# Patient Record
Sex: Male | Born: 1955 | Race: White | Hispanic: No | Marital: Married | Smoking: Never smoker
Health system: Southern US, Community
[De-identification: ages and names within clinical notes are randomized; demographics above are authoritative.]

## PROBLEM LIST (undated history)

## (undated) DIAGNOSIS — K219 Gastro-esophageal reflux disease without esophagitis: Secondary | ICD-10-CM

## (undated) DIAGNOSIS — R7301 Impaired fasting glucose: Secondary | ICD-10-CM

## (undated) DIAGNOSIS — E785 Hyperlipidemia, unspecified: Secondary | ICD-10-CM

## (undated) HISTORY — DX: Impaired fasting glucose: R73.01

## (undated) HISTORY — PX: HERNIA REPAIR: SHX51

## (undated) HISTORY — DX: Gastro-esophageal reflux disease without esophagitis: K21.9

## (undated) HISTORY — DX: Hyperlipidemia, unspecified: E78.5

---

## 2004-07-08 ENCOUNTER — Ambulatory Visit (HOSPITAL_COMMUNITY): Admission: RE | Admit: 2004-07-08 | Discharge: 2004-07-08 | Payer: Self-pay | Admitting: General Surgery

## 2018-02-12 ENCOUNTER — Encounter: Payer: Self-pay | Admitting: Interventional Cardiology

## 2018-03-10 ENCOUNTER — Encounter: Payer: Self-pay | Admitting: Interventional Cardiology

## 2018-03-10 ENCOUNTER — Ambulatory Visit (INDEPENDENT_AMBULATORY_CARE_PROVIDER_SITE_OTHER): Payer: PRIVATE HEALTH INSURANCE | Admitting: Interventional Cardiology

## 2018-03-10 ENCOUNTER — Encounter (INDEPENDENT_AMBULATORY_CARE_PROVIDER_SITE_OTHER): Payer: Self-pay

## 2018-03-10 VITALS — BP 118/78 | HR 73 | Ht 67.0 in | Wt 187.0 lb

## 2018-03-10 DIAGNOSIS — Z8249 Family history of ischemic heart disease and other diseases of the circulatory system: Secondary | ICD-10-CM

## 2018-03-10 DIAGNOSIS — E785 Hyperlipidemia, unspecified: Secondary | ICD-10-CM

## 2018-03-10 NOTE — Progress Notes (Signed)
Cardiology Office Note:    Date:  03/10/2018   ID:  Gabriel Stephens, DOB 07/03/1955, MRN [id number]  PCP:  Gabriel JoeSwayne, David, MD  Cardiologist:  No primary care provider on file.   Referring MD: No ref. provider found   Chief Complaint  Patient presents with  . Advice Only    Family history CAD  . Hyperlipidemia    History of Present Illness:    Gabriel Stephens is a 63 y.o. male with a strong family history of CAD (father), prediabetes, hyperlipidemia, who is referred by Gabriel Stephens for risk evaluation and longitudinal preventative management.  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  He has no cardiac complaints.  He exercises relatively regularly achieving between 120 and 150 minutes of moderate activity per week.  There are no limitations with reference to endurance, dyspnea, or chest pain with cardio/aerobic activity.  He has never had a cardiac event and specifically denies palpitations, orthopnea, PND, and syncope.  He wants to be proactive with reference to the potential development of vascular disease.  His risk profile includes family history of premature atherosclerosis (father who had his first myocardial infarction younger than age 61), hyperlipidemia with most recent LDL C of 142 and cholesterol total of 218.  Has never smoked.  He is not diabetic.  He sleeps well and does not have body type features of one with sleep apnea.  Past Medical History:  Diagnosis Date  . Elevated fasting glucose    history of mildly  . GERD (gastroesophageal reflux disease)   . Hyperlipidemia     Past Surgical History:  Procedure Laterality Date  . HERNIA REPAIR      Current Medications: No outpatient medications have been marked as taking for the 03/10/18 encounter (Office Visit) with Lyn RecordsSmith, Gabriel Bertucci W, MD.     Allergies:   Patient has no known allergies.   Social History   Socioeconomic History  . Marital status: Married   Spouse name: LORI  . Number of children: 3  . Years of education: COLLEGE  . Highest education level: Not on file  Occupational History  . Occupation: Airline pilotALES  Social Needs  . Financial resource strain: Not on file  . Food insecurity:    Worry: Not on file    Inability: Not on file  . Transportation needs:    Medical: Not on file    Non-medical: Not on file  Tobacco Use  . Smoking status: Never Smoker  . Smokeless tobacco: Never Used  Substance and Sexual Activity  . Alcohol use: Yes  . Drug use: Never  . Sexual activity: Not on file  Lifestyle  . Physical activity:    Days per week: Not on file    Minutes per session: Not on file  . Stress: Not on file  Relationships  . Social connections:    Talks on phone: Not on file    Gets together: Not on file    Attends religious service: Not on file    Active member of club or organization: Not on file    Attends meetings of clubs or organizations: Not on file    Relationship status: Not on file  Other Topics Concern  . Not on file  Social History Narrative  . Not on file     Family History: The patient's family history includes Alcoholism in his father; Congestive Heart Failure in his father; Diabetes in his father, mother,  sister, and sister; Heart attack (age of onset: 38) in his father; Heart disease in his father; Hyperlipidemia in his mother; Hypertension in his mother; Kidney disease in his father; Lung cancer in his father. There is no history of Colon cancer, Colon polyps, or Liver disease.  ROS:   Please see the history of present illness.    There is no real complaints.  Occasional joint discomfort related to activity.  All other systems reviewed and are negative.  EKGs/Labs/Other Studies Reviewed:    The following studies were reviewed today:  LDL cholesterol 142, triglyceride 162, total cholesterol 218, HDL 44, on blood work from December 18, 2017.  BUN and creatinine and blood sugar were normal.  EKG:  EKG is  normal sinus rhythm with interventricular conduction delay of nonspecific variety measuring 114 ms..  The tracing is otherwise unremarkable.  Recent Labs: No results found for requested labs within last 8760 hours.  Recent Lipid Panel No results found for: CHOL, TRIG, HDL, CHOLHDL, VLDL, LDLCALC, LDLDIRECT  Physical Exam:    VS:  BP 118/78   Pulse 73   Ht 5\' 7"  (1.702 m)   Wt 187 lb (84.8 kg)   BMI 29.29 kg/m     Wt Readings from Last 3 Encounters:  03/10/18 187 lb (84.8 kg)     GEN: Healthy-appearing.  Mildly overweight. No acute distress HEENT: Normal NECK: No JVD. LYMPHATICS: No lymphadenopathy CARDIAC: RRR.  No murmur, gallop, edema VASCULAR: Pulses are present in the radial and carotids bilaterally and 2+, Bruits are absent in the carotid region RESPIRATORY:  Clear to auscultation without rales, wheezing or rhonchi  ABDOMEN: Soft, non-tender, non-distended, No pulsatile mass, MUSCULOSKELETAL: No deformity  SKIN: Warm and dry NEUROLOGIC:  Alert and oriented x 3 PSYCHIATRIC:  Normal affect   ASSESSMENT:    1. Family history of heart disease   2. Hyperlipidemia LDL goal <70    PLAN:    In order of problems listed above:  1. Gabriel Stephens has no symptoms or clinical evidence of obstructive coronary or other vascular disease.  He is at increased risk due to family history, untreated significant hyperlipidemia, age, and sex.  I have recommended a CT coronary calcium score.  Calcium deposition he should definitely be on lipid therapy.  I encouraged continued aerobic activity and to increase duration to 150 minutes/week of moderate activity.  He currently is not inclined to use statin therapy but findings on coronary CT may influence future decision.  I do not believe functional testing will be useful in absence of symptoms in a person who is vigorously active in his typical lifestyle.   Medication Adjustments/Labs and Tests Ordered: Current medicines are reviewed at length with  the patient today.  Concerns regarding medicines are outlined above.  Orders Placed This Encounter  Procedures  . CT CARDIAC SCORING  . EKG 12-Lead   No orders of the defined types were placed in this encounter.   Patient Instructions  Medication Instructions:  No change If you need a refill on your cardiac medications before your next appointment, please call your pharmacy.   Lab work: none If you have labs (blood work) drawn today and your tests are completely normal, you will receive your results only by: Marland Kitchen MyChart Message (if you have MyChart) OR . A paper copy in the mail If you have any lab test that is abnormal or we need to change your treatment, we will call you to review the results.  Testing/Procedures: Please schedule calcium  score ct scan.   There is $150 cost.  This is not covered by insurance.  Follow-Up: Your physician recommends that you schedule a follow-up appointment as needed with Dr. Katrinka Blazing.  Any Other Special Instructions Will Be Listed Below (If Applicable).       Signed, Lesleigh Noe, MD  03/10/2018 5:23 PM    [facility]

## 2018-03-10 NOTE — Patient Instructions (Signed)
Medication Instructions:  No change If you need a refill on your cardiac medications before your next appointment, please call your pharmacy.   Lab work: none If you have labs (blood work) drawn today and your tests are completely normal, you will receive your results only by: Marland Kitchen MyChart Message (if you have MyChart) OR . A paper copy in the mail If you have any lab test that is abnormal or we need to change your treatment, we will call you to review the results.  Testing/Procedures: Please schedule calcium score ct scan.   There is $150 cost.  This is not covered by insurance.  Follow-Up: Your physician recommends that you schedule a follow-up appointment as needed with Dr. Katrinka Blazing.  Any Other Special Instructions Will Be Listed Below (If Applicable).

## 2018-03-24 ENCOUNTER — Ambulatory Visit (INDEPENDENT_AMBULATORY_CARE_PROVIDER_SITE_OTHER)
Admission: RE | Admit: 2018-03-24 | Discharge: 2018-03-24 | Disposition: A | Discharge status: Home / Self Care | Payer: Self-pay | Source: Ambulatory Visit | Attending: Interventional Cardiology | Admitting: Interventional Cardiology

## 2018-03-24 DIAGNOSIS — Z8249 Family history of ischemic heart disease and other diseases of the circulatory system: Secondary | ICD-10-CM

## 2019-11-09 ENCOUNTER — Other Ambulatory Visit: Payer: Self-pay | Admitting: General Surgery

## 2019-11-09 DIAGNOSIS — K439 Ventral hernia without obstruction or gangrene: Secondary | ICD-10-CM

## 2019-11-23 ENCOUNTER — Ambulatory Visit
Admission: RE | Admit: 2019-11-23 | Discharge: 2019-11-23 | Disposition: A | Discharge status: Home / Self Care | Payer: 59 | Source: Ambulatory Visit | Attending: General Surgery | Admitting: General Surgery

## 2019-11-23 ENCOUNTER — Other Ambulatory Visit: Payer: Self-pay

## 2019-11-23 DIAGNOSIS — K439 Ventral hernia without obstruction or gangrene: Secondary | ICD-10-CM

## 2020-12-02 IMAGING — CT CT ABDOMEN W/O CM
1 of 2 series · 13 of 32 positions shown, 19 images · non-contrast
Comparison: None

CLINICAL DATA: Abdominal pain.  History of ventral hernia

EXAM:
CT ABDOMEN WITHOUT CONTRAST
TECHNIQUE: Multidetector CT imaging of the abdomen was performed following the
standard protocol without IV contrast.

[Series 2: abd w/(date) · axial · 0.72mm/px · z∈[-337,-87]mm · 13 of 58 slices shown, 19 images]
[im 4/58  soft-tissue]
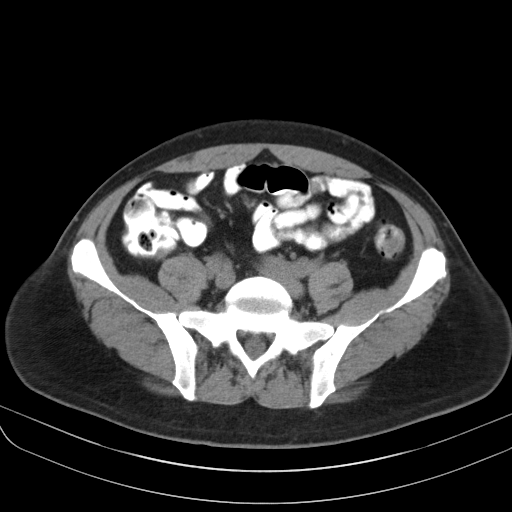
[im 4/58  bone]
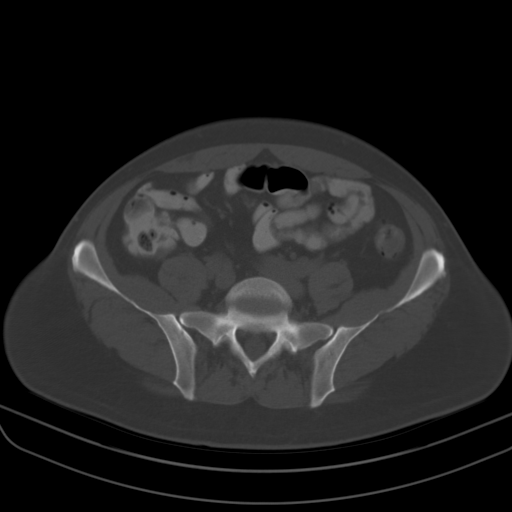
[im 8/58  soft-tissue]
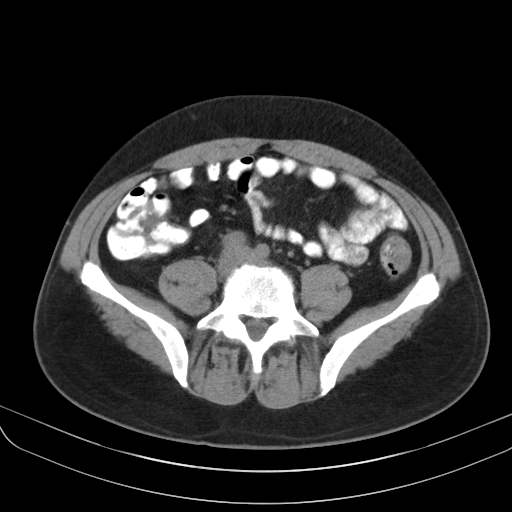
[im 12/58  soft-tissue]
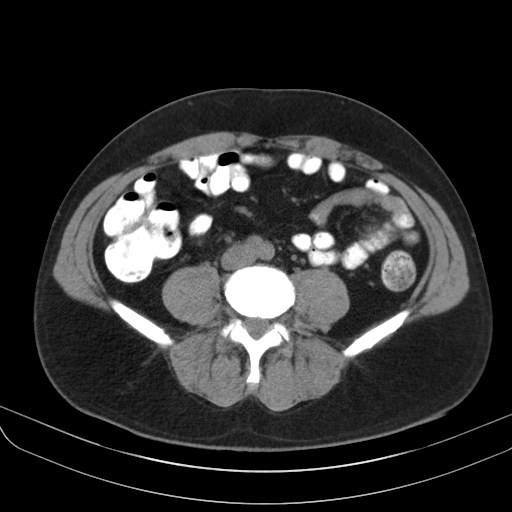
[im 16/58  soft-tissue]
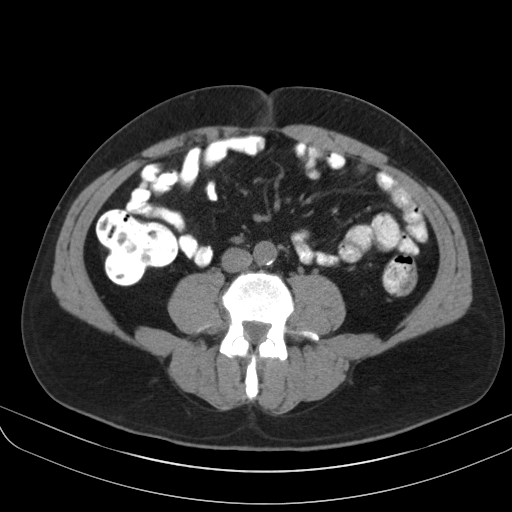
[im 20/58  soft-tissue]
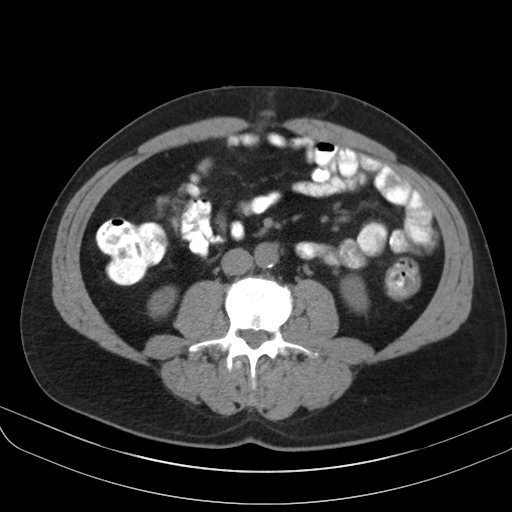
[im 23/58  soft-tissue]
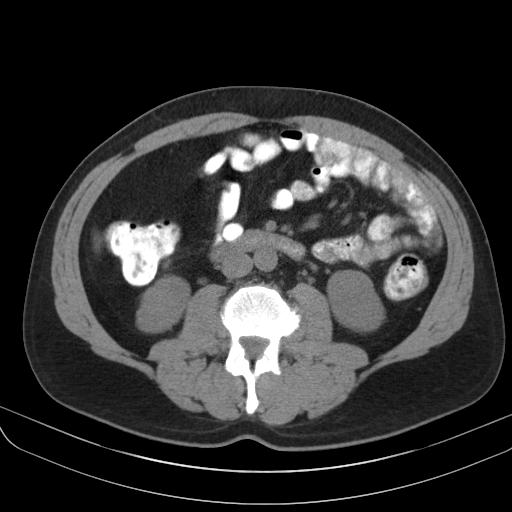
[im 31/58  soft-tissue]
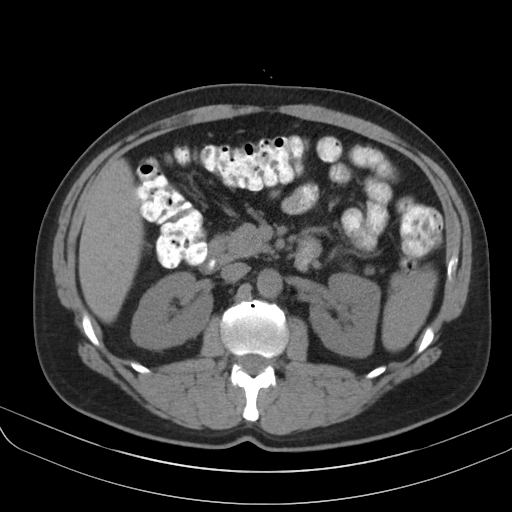
[im 35/58  soft-tissue]
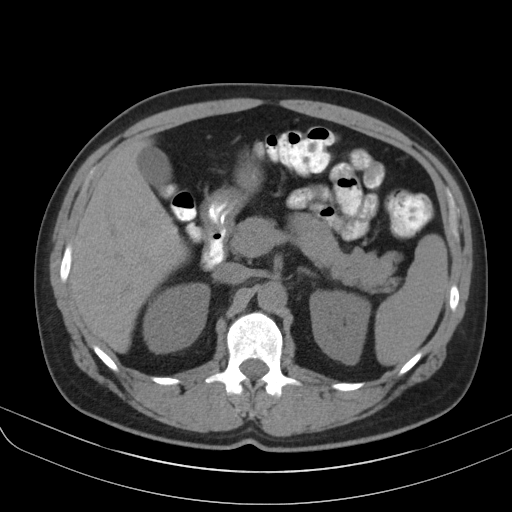
[im 39/58  soft-tissue]
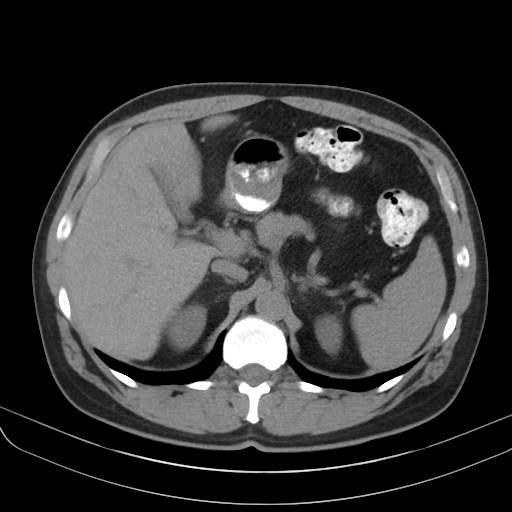
[im 39/58  bone]
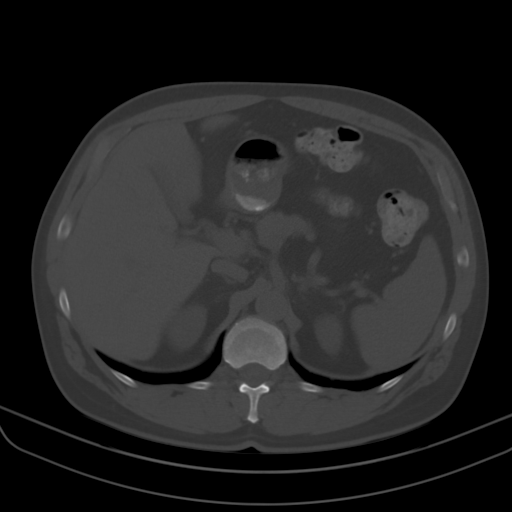
[im 42/58  soft-tissue]
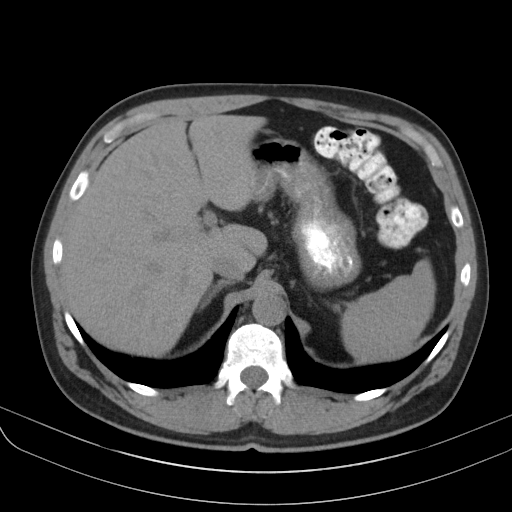
[im 42/58  lung]
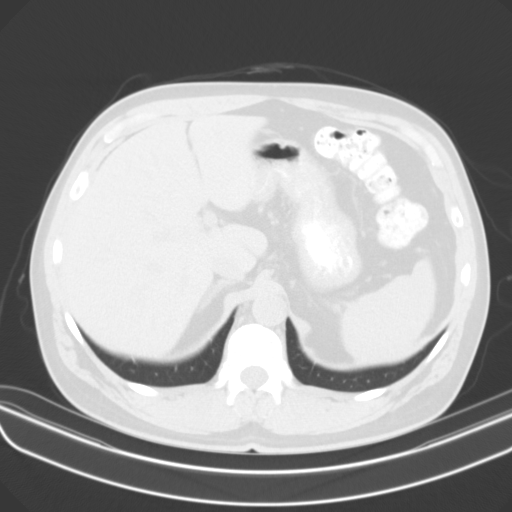
[im 46/58  soft-tissue]
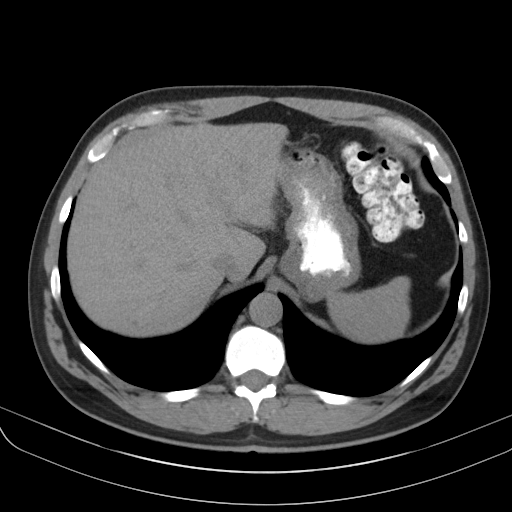
[im 46/58  lung]
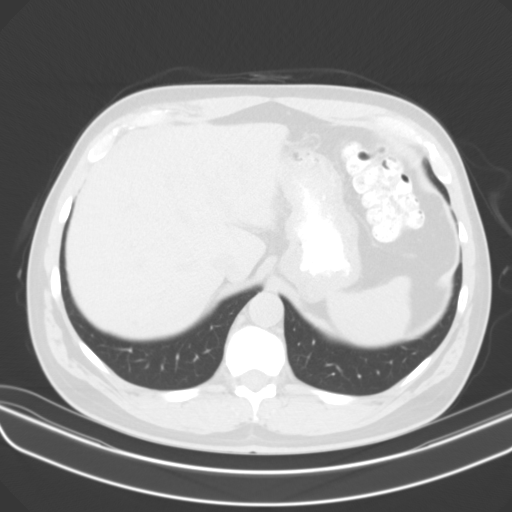
[im 50/58  soft-tissue]
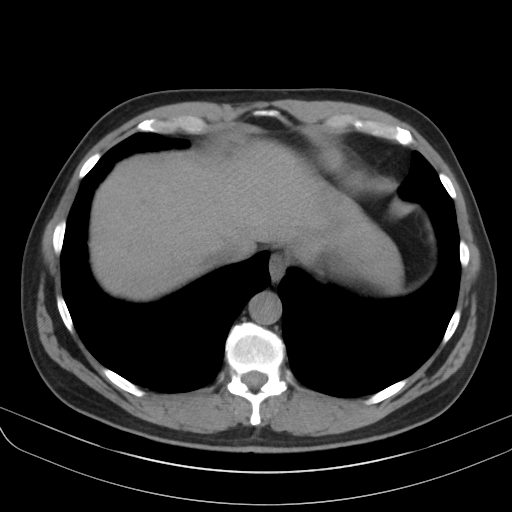
[im 50/58  lung]
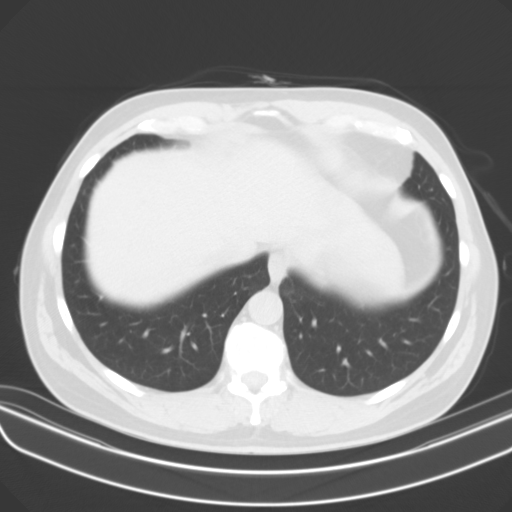
[im 54/58  soft-tissue]
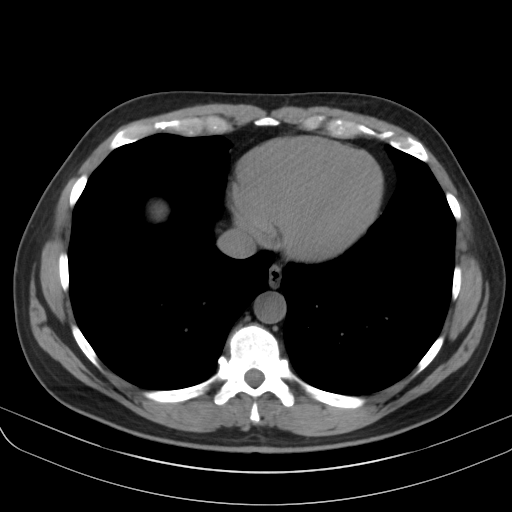
[im 54/58  lung]
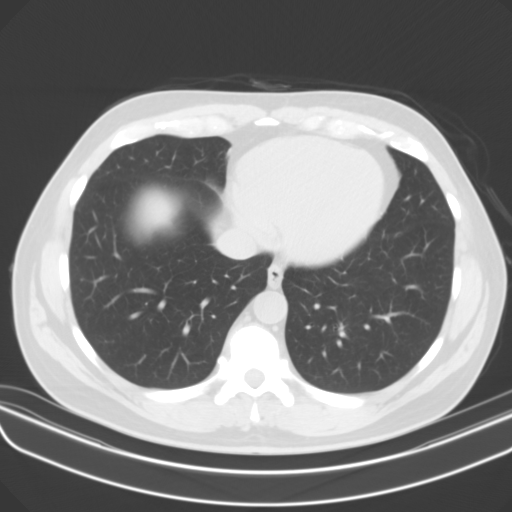

[13 of 32 positions shown; findings below may reference images not displayed]

FINDINGS: Lower chest: Lung bases are clear. No effusions. Heart is normal
size.

Hepatobiliary: Small hypodensity posteriorly in the right lobe of
the liver, likely small cyst, difficult to characterize on this
noncontrast study. Gallbladder unremarkable.

Pancreas: No focal abnormality or ductal dilatation.

Spleen: No focal abnormality.  Normal size.

Adrenals/Urinary Tract: No renal or adrenal mass. No stones or
hydronephrosis.

Stomach/Bowel: Stomach, visualized large and small bowel
unremarkable. Only the proximal aspect of the appendix is visualized
and appears normal.

Vascular/Lymphatic: Scattered aortic atherosclerosis. No evidence of
aneurysm or adenopathy.

Other: No free fluid or free air.

Musculoskeletal: No acute bony abnormality.
IMPRESSION: No acute findings in the abdomen.

Scattered aortic atherosclerosis.

## 2022-03-06 ENCOUNTER — Other Ambulatory Visit: Payer: Self-pay | Admitting: General Surgery

## 2022-03-06 DIAGNOSIS — K439 Ventral hernia without obstruction or gangrene: Secondary | ICD-10-CM

## 2022-03-25 ENCOUNTER — Ambulatory Visit
Admission: RE | Admit: 2022-03-25 | Discharge: 2022-03-25 | Disposition: A | Discharge status: Home / Self Care | Payer: BC Managed Care – PPO | Source: Ambulatory Visit | Attending: General Surgery | Admitting: General Surgery

## 2022-03-25 DIAGNOSIS — K439 Ventral hernia without obstruction or gangrene: Secondary | ICD-10-CM

## 2022-03-25 MED ORDER — IOPAMIDOL (ISOVUE-300) INJECTION 61%
100.0000 mL | Freq: Once | INTRAVENOUS | Status: AC | PRN
  Administered 2022-03-25: 100 mL via INTRAVENOUS

## 2022-05-01 ENCOUNTER — Ambulatory Visit: Payer: Self-pay | Admitting: General Surgery

## 2022-05-08 ENCOUNTER — Other Ambulatory Visit: Payer: Self-pay

## 2022-05-08 ENCOUNTER — Encounter (HOSPITAL_BASED_OUTPATIENT_CLINIC_OR_DEPARTMENT_OTHER): Payer: Self-pay | Admitting: General Surgery

## 2022-05-14 NOTE — Progress Notes (Signed)

## 2022-05-16 ENCOUNTER — Encounter (HOSPITAL_BASED_OUTPATIENT_CLINIC_OR_DEPARTMENT_OTHER)
Admission: RE | Disposition: A | Discharge status: Home / Self Care | Payer: Self-pay | Source: Home / Self Care | Attending: General Surgery

## 2022-05-16 ENCOUNTER — Other Ambulatory Visit: Payer: Self-pay

## 2022-05-16 ENCOUNTER — Ambulatory Visit (HOSPITAL_BASED_OUTPATIENT_CLINIC_OR_DEPARTMENT_OTHER): Payer: BC Managed Care – PPO | Admitting: Anesthesiology

## 2022-05-16 ENCOUNTER — Ambulatory Visit (HOSPITAL_BASED_OUTPATIENT_CLINIC_OR_DEPARTMENT_OTHER)
Admission: RE | Admit: 2022-05-16 | Discharge: 2022-05-16 | Disposition: A | Discharge status: Home / Self Care | Payer: BC Managed Care – PPO | Attending: General Surgery | Admitting: General Surgery

## 2022-05-16 ENCOUNTER — Encounter (HOSPITAL_BASED_OUTPATIENT_CLINIC_OR_DEPARTMENT_OTHER): Payer: Self-pay | Admitting: General Surgery

## 2022-05-16 DIAGNOSIS — K429 Umbilical hernia without obstruction or gangrene: Secondary | ICD-10-CM | POA: Diagnosis not present

## 2022-05-16 DIAGNOSIS — K439 Ventral hernia without obstruction or gangrene: Secondary | ICD-10-CM | POA: Diagnosis present

## 2022-05-16 HISTORY — PX: SUPRA-UMBILICAL HERNIA: SHX6105

## 2022-05-16 SURGERY — SUPRA-UMBILICAL HERNIA
Anesthesia: General | Site: Abdomen

## 2022-05-16 MED ORDER — CELECOXIB 200 MG PO CAPS
ORAL_CAPSULE | ORAL | Status: AC
  Filled 2022-05-16: qty 1

## 2022-05-16 MED ORDER — GABAPENTIN 300 MG PO CAPS
300.0000 mg | ORAL_CAPSULE | ORAL | Status: AC
  Administered 2022-05-16: 300 mg via ORAL

## 2022-05-16 MED ORDER — CEFAZOLIN SODIUM-DEXTROSE 2-4 GM/100ML-% IV SOLN
2.0000 g | INTRAVENOUS | Status: AC
  Administered 2022-05-16: 2 g via INTRAVENOUS

## 2022-05-16 MED ORDER — BUPIVACAINE-EPINEPHRINE 0.25% -1:200000 IJ SOLN
INTRAMUSCULAR | Status: DC | PRN
  Administered 2022-05-16: 20 mL

## 2022-05-16 MED ORDER — CHLORHEXIDINE GLUCONATE CLOTH 2 % EX PADS: 6.0000 | MEDICATED_PAD | Freq: Once | CUTANEOUS | Status: DC

## 2022-05-16 MED ORDER — FENTANYL CITRATE (PF) 100 MCG/2ML IJ SOLN
INTRAMUSCULAR | Status: AC
  Filled 2022-05-16: qty 2

## 2022-05-16 MED ORDER — CEFAZOLIN SODIUM-DEXTROSE 2-4 GM/100ML-% IV SOLN
INTRAVENOUS | Status: AC
  Filled 2022-05-16: qty 100

## 2022-05-16 MED ORDER — OXYCODONE HCL 5 MG/5ML PO SOLN: 5.0000 mg | Freq: Once | ORAL | Status: DC | PRN

## 2022-05-16 MED ORDER — FENTANYL CITRATE (PF) 100 MCG/2ML IJ SOLN
INTRAMUSCULAR | Status: DC | PRN
  Administered 2022-05-16 (×2): 50 ug via INTRAVENOUS

## 2022-05-16 MED ORDER — 0.9 % SODIUM CHLORIDE (POUR BTL) OPTIME
TOPICAL | Status: DC | PRN
  Administered 2022-05-16: 200 mL

## 2022-05-16 MED ORDER — MIDAZOLAM HCL 5 MG/5ML IJ SOLN
INTRAMUSCULAR | Status: DC | PRN
  Administered 2022-05-16: 1 mg via INTRAVENOUS

## 2022-05-16 MED ORDER — OXYCODONE HCL 5 MG PO TABS: 5.0000 mg | ORAL_TABLET | Freq: Once | ORAL | Status: DC | PRN

## 2022-05-16 MED ORDER — LACTATED RINGERS IV SOLN: INTRAVENOUS | Status: DC

## 2022-05-16 MED ORDER — FENTANYL CITRATE (PF) 100 MCG/2ML IJ SOLN: 25.0000 ug | INTRAMUSCULAR | Status: DC | PRN

## 2022-05-16 MED ORDER — ACETAMINOPHEN 500 MG PO TABS
1000.0000 mg | ORAL_TABLET | ORAL | Status: AC
  Administered 2022-05-16: 1000 mg via ORAL

## 2022-05-16 MED ORDER — PROMETHAZINE HCL 25 MG/ML IJ SOLN: 6.2500 mg | INTRAMUSCULAR | Status: DC | PRN

## 2022-05-16 MED ORDER — GABAPENTIN 300 MG PO CAPS
ORAL_CAPSULE | ORAL | Status: AC
  Filled 2022-05-16: qty 1

## 2022-05-16 MED ORDER — DEXAMETHASONE SODIUM PHOSPHATE 4 MG/ML IJ SOLN
INTRAMUSCULAR | Status: DC | PRN
  Administered 2022-05-16: 5 mg via INTRAVENOUS

## 2022-05-16 MED ORDER — MIDAZOLAM HCL 2 MG/2ML IJ SOLN
INTRAMUSCULAR | Status: AC
  Filled 2022-05-16: qty 2

## 2022-05-16 MED ORDER — ACETAMINOPHEN 500 MG PO TABS
ORAL_TABLET | ORAL | Status: AC
  Filled 2022-05-16: qty 2

## 2022-05-16 MED ORDER — ONDANSETRON HCL 4 MG/2ML IJ SOLN
INTRAMUSCULAR | Status: DC | PRN
  Administered 2022-05-16: 4 mg via INTRAVENOUS

## 2022-05-16 MED ORDER — CELECOXIB 200 MG PO CAPS
200.0000 mg | ORAL_CAPSULE | ORAL | Status: AC
  Administered 2022-05-16: 200 mg via ORAL

## 2022-05-16 MED ORDER — OXYCODONE HCL 5 MG PO TABS: 5.0000 mg | ORAL_TABLET | Freq: Four times a day (QID) | ORAL | 0 refills | Status: AC | PRN

## 2022-05-16 MED ORDER — PROPOFOL 10 MG/ML IV BOLUS
INTRAVENOUS | Status: DC | PRN
  Administered 2022-05-16: 180 mg via INTRAVENOUS

## 2022-05-16 MED ORDER — LIDOCAINE HCL (CARDIAC) PF 100 MG/5ML IV SOSY
PREFILLED_SYRINGE | INTRAVENOUS | Status: DC | PRN
  Administered 2022-05-16: 80 mg via INTRAVENOUS

## 2022-05-16 SURGICAL SUPPLY — 45 items
ADH SKN CLS APL DERMABOND .7 (GAUZE/BANDAGES/DRESSINGS) ×1
APL PRP STRL LF DISP 70% ISPRP (MISCELLANEOUS) ×1
APL SKNCLS STERI-STRIP NONHPOA (GAUZE/BANDAGES/DRESSINGS)
BENZOIN TINCTURE PRP APPL 2/3 (GAUZE/BANDAGES/DRESSINGS) IMPLANT
BLADE CLIPPER SURG (BLADE) IMPLANT
BLADE SURG 15 STRL LF DISP TIS (BLADE) ×2 IMPLANT
BLADE SURG 15 STRL SS (BLADE) ×1
CHLORAPREP W/TINT 26 (MISCELLANEOUS) ×2 IMPLANT
COVER BACK TABLE 60X90IN (DRAPES) ×2 IMPLANT
COVER MAYO STAND STRL (DRAPES) ×2 IMPLANT
DERMABOND ADVANCED .7 DNX12 (GAUZE/BANDAGES/DRESSINGS) ×2 IMPLANT
DRAPE LAPAROTOMY TRNSV 102X78 (DRAPES) ×2 IMPLANT
DRAPE UTILITY XL STRL (DRAPES) ×2 IMPLANT
DRSG TEGADERM 4X4.75 (GAUZE/BANDAGES/DRESSINGS) IMPLANT
ELECT COATED BLADE 2.86 ST (ELECTRODE) ×2 IMPLANT
ELECT REM PT RETURN 9FT ADLT (ELECTROSURGICAL) ×1
ELECTRODE REM PT RTRN 9FT ADLT (ELECTROSURGICAL) ×2 IMPLANT
GAUZE SPONGE 4X4 12PLY STRL LF (GAUZE/BANDAGES/DRESSINGS) IMPLANT
GLOVE BIO SURGEON STRL SZ7.5 (GLOVE) ×2 IMPLANT
GOWN STRL REUS W/ TWL LRG LVL3 (GOWN DISPOSABLE) ×4 IMPLANT
GOWN STRL REUS W/TWL LRG LVL3 (GOWN DISPOSABLE) ×2
NDL HYPO 22X1.5 SAFETY MO (MISCELLANEOUS) IMPLANT
NDL HYPO 25X1 1.5 SAFETY (NEEDLE) ×2 IMPLANT
NEEDLE HYPO 22X1.5 SAFETY MO (MISCELLANEOUS) IMPLANT
NEEDLE HYPO 25X1 1.5 SAFETY (NEEDLE) ×1 IMPLANT
NEEDLE SAFETY HYPO 22GAX1.5 (MISCELLANEOUS)
NS IRRIG 1000ML POUR BTL (IV SOLUTION) IMPLANT
PACK BASIN DAY SURGERY FS (CUSTOM PROCEDURE TRAY) ×2 IMPLANT
PENCIL SMOKE EVACUATOR (MISCELLANEOUS) ×2 IMPLANT
SLEEVE SCD COMPRESS KNEE MED (STOCKING) IMPLANT
SPIKE FLUID TRANSFER (MISCELLANEOUS) ×2 IMPLANT
SPONGE T-LAP 18X18 ~~LOC~~+RFID (SPONGE) ×2 IMPLANT
STRIP CLOSURE SKIN 1/2X4 (GAUZE/BANDAGES/DRESSINGS) IMPLANT
SUT MON AB 4-0 PC3 18 (SUTURE) ×2 IMPLANT
SUT NOVA NAB DX-16 0-1 5-0 T12 (SUTURE) IMPLANT
SUT NOVA NAB GS-21 1 T12 (SUTURE) ×2 IMPLANT
SUT SILK 3 0 SH 30 (SUTURE) IMPLANT
SUT VIC AB 2-0 SH 27 (SUTURE) ×1
SUT VIC AB 2-0 SH 27XBRD (SUTURE) ×2 IMPLANT
SUT VIC AB 3-0 54X BRD REEL (SUTURE) IMPLANT
SUT VIC AB 3-0 BRD 54 (SUTURE)
SUT VIC AB 3-0 SH 27 (SUTURE)
SUT VIC AB 3-0 SH 27X BRD (SUTURE) IMPLANT
SYR CONTROL 10ML LL (SYRINGE) ×2 IMPLANT
TOWEL GREEN STERILE FF (TOWEL DISPOSABLE) ×4 IMPLANT

## 2022-05-16 NOTE — Anesthesia Preprocedure Evaluation (Signed)
Anesthesia Evaluation  Patient identified by MRN, date of birth, ID band Patient awake    Reviewed: Allergy & Precautions, NPO status , Patient's Chart, lab work & pertinent test results  Airway Mallampati: II  TM Distance: >3 FB Neck ROM: Full    Dental  (+) Teeth Intact, Dental Advisory Given   Pulmonary neg pulmonary ROS   Pulmonary exam normal breath sounds clear to auscultation       Cardiovascular negative cardio ROS Normal cardiovascular exam Rhythm:Regular Rate:Normal     Neuro/Psych negative neurological ROS  negative psych ROS   GI/Hepatic Neg liver ROS,GERD  ,, SUPRA UMBILICAL HERNIA   Endo/Other  negative endocrine ROS    Renal/GU negative Renal ROS     Musculoskeletal negative musculoskeletal ROS (+)    Abdominal   Peds  Hematology negative hematology ROS (+)   Anesthesia Other Findings Day of surgery medications reviewed with the patient.  Reproductive/Obstetrics                              Anesthesia Physical Anesthesia Plan  ASA: 2  Anesthesia Plan: General   Post-op Pain Management: Tylenol PO (pre-op)*, Gabapentin PO (pre-op)* and Celebrex PO (pre-op)*   Induction: Intravenous  PONV Risk Score and Plan: 2 and Midazolam, Dexamethasone and Ondansetron  Airway Management Planned: Oral ETT  Additional Equipment:   Intra-op Plan:   Post-operative Plan: Extubation in OR  Informed Consent: I have reviewed the patients History and Physical, chart, labs and discussed the procedure including the risks, benefits and alternatives for the proposed anesthesia with the patient or authorized representative who has indicated his/her understanding and acceptance.     Dental advisory given  Plan Discussed with: CRNA  Anesthesia Plan Comments:          Anesthesia Quick Evaluation

## 2022-05-16 NOTE — H&P (Signed)
PROVIDER: Landry Corporal, MD  MRN: [id number] DOB: 1955/09/20 Subjective  Chief Complaint: Follow-up   History of Present Illness: Gabriel Stephens is a 67 y.o. male who is seen today for ventral hernia. The patient is a 67 year old white male who is about 5 months status post supraumbilical ventral hernia repair primarily with permanent stitches. In the early postoperative period he continued to feel the same bulge and discomfort. The area that he feels this is near the umbilicus and below the area where we repaired a small ventral hernia previously. He denies any fevers or chills. He denies any nausea or vomiting. He feels the discomfort when he is real active.    Review of Systems: A complete review of systems was obtained from the patient. I have reviewed this information and discussed as appropriate with the patient. See HPI as well for other ROS.  ROS  Medical History: History reviewed. No pertinent past medical history.  Patient Active Problem List Diagnosis Ventral hernia without obstruction or gangrene  Past Surgical History: Procedure Laterality Date HERNIA REPAIR   No Known Allergies  Current Outpatient Medications on File Prior to Visit Medication Sig Dispense Refill oxyCODONE (ROXICODONE) 5 MG immediate release tablet Take 1 tablet (5 mg total) by mouth every 6 (six) hours as needed for Pain 10 tablet 0  No current facility-administered medications on file prior to visit.  Family History Problem Relation Age of Onset Diabetes Mother Hyperlipidemia (Elevated cholesterol) Father Diabetes Father Myocardial Infarction (Heart attack) Father Diabetes Sister   Social History  Tobacco Use Smoking Status Never Smokeless Tobacco Never   Social History  Socioeconomic History Marital status: Married Tobacco Use Smoking status: Never Smokeless tobacco: Never  Objective:  Vitals: PainSc: 0-No pain  There is no height or weight on file to calculate  BMI.  Physical Exam Constitutional: General: He is not in acute distress. Appearance: Normal appearance. HENT: Head: Normocephalic and atraumatic. Right Ear: External ear normal. Left Ear: External ear normal. Nose: Nose normal. Mouth/Throat: Mouth: Mucous membranes are moist. Pharynx: Oropharynx is clear. Eyes: General: No scleral icterus. Extraocular Movements: Extraocular movements intact. Conjunctiva/sclera: Conjunctivae normal. Pupils: Pupils are equal, round, and reactive to light. Cardiovascular: Rate and Rhythm: Normal rate and regular rhythm. Pulses: Normal pulses. Heart sounds: Normal heart sounds. Pulmonary: Effort: Pulmonary effort is normal. No respiratory distress. Breath sounds: Normal breath sounds. Abdominal: General: Abdomen is flat. Bowel sounds are normal. There is no distension. Palpations: Abdomen is soft. Tenderness: There is no abdominal tenderness. Comments: There may be a small palpable bulge just above the umbilicus. There is no sign of obstruction. Musculoskeletal: General: No swelling or deformity. Normal range of motion. Cervical back: Normal range of motion and neck supple. No tenderness. Skin: General: Skin is warm and dry. Coloration: Skin is not jaundiced. Neurological: General: No focal deficit present. Mental Status: He is alert and oriented to person, place, and time. Psychiatric: Mood and Affect: Mood normal. Behavior: Behavior normal.    Labs, Imaging and Diagnostic Testing:  Assessment and Plan:  Diagnoses and all orders for this visit:  Ventral hernia without obstruction or gangrene    The patient appears to have a small supraumbilical ventral hernia below the previous repair that we did not appreciate at the time of the previous surgery. Because it is causing him some discomfort it would be reasonable to repair this. I have discussed with him in detail the risks and benefits of the operation as well as some of the  technical aspects  including the possible use of mesh and he understands and wishes to proceed. He will plan for this around his schedule and let us know when he would like to do this.

## 2022-05-16 NOTE — Op Note (Signed)
05/16/2022  3:22 PM  PATIENT:  Gabriel Stephens  67 y.o. male  PRE-OPERATIVE DIAGNOSIS:  SUPRA UMBILICAL HERNIA  POST-OPERATIVE DIAGNOSIS:  SUPRA UMBILICAL HERNIA  PROCEDURE:  Procedure(s): SUPRA-UMBILICAL HERNIA repair (N/A)  SURGEON:  Surgeon(s) and Role:    * Jovita Kussmaul, MD - Primary  PHYSICIAN ASSISTANT:   ASSISTANTS: none   ANESTHESIA:   local and general  EBL:  5 mL   BLOOD ADMINISTERED:none  DRAINS: none   LOCAL MEDICATIONS USED:  MARCAINE     SPECIMEN:  No Specimen  DISPOSITION OF SPECIMEN:  N/A  COUNTS:  YES  TOURNIQUET:  * No tourniquets in log *  DICTATION: .Dragon Dictation  After informed consent was obtained the patient was brought to the operating room and placed in the supine position on the operating table.  After adequate induction of general anesthesia the patient's abdomen was prepped with ChloraPrep, allowed to dry, and draped in usual sterile manner.  An appropriate timeout was performed.  The area just above the umbilicus was infiltrated with quarter percent Marcaine.  A small vertically oriented incision was made from the upper edge of the umbilicus superiorly with a 15 blade knife.  The incision was carried through the skin and subcutaneous tissue sharply with the electrocautery.  The dissection was carried down to the fascia of the abdominal wall.  In doing so I was able to identify a hernia defect.  Preoperatively the hernia measured about 1 cm.  The hernia was freed from the rest of the subcutaneous tissue sharply with the electrocautery.  The hernia was then able to be easily reduced.  The actual fascial defect measured only about 6 mm.  The fascial defect was closed with interrupted #1 Novafil stitches.  The incision was then irrigated with copious amounts of saline.  The hernia seem well repaired.  The subcutaneous tissue was closed with interrupted 2-0 Vicryl stitches.  The skin was then closed with interrupted 4-0 Monocryl subcuticular  stitches.  Dermabond dressings were applied.  The patient tolerated the procedure well.  At the end of the case all needle sponge and instrument counts were correct.  The patient was then awakened and taken to recovery in stable condition.  PLAN OF CARE: Discharge to home after PACU  PATIENT DISPOSITION:  PACU - hemodynamically stable.   Delay start of Pharmacological VTE agent (>24hrs) due to surgical blood loss or risk of bleeding: not applicable

## 2022-05-16 NOTE — Interval H&P Note (Signed)
History and Physical Interval Note:  05/16/2022 2:28 PM  Gabriel Stephens  has presented today for surgery, with the diagnosis of SUPRA UMBILICAL HERNIA.  The various methods of treatment have been discussed with the patient and family. After consideration of risks, benefits and other options for treatment, the patient has consented to  Procedure(s): SUPRA-UMBILICAL HERNIA repair w/poss mesh (N/A) as a surgical intervention.  The patient's history has been reviewed, patient examined, no change in status, stable for surgery.  I have reviewed the patient's chart and labs.  Questions were answered to the patient's satisfaction.     Autumn Messing III

## 2022-05-16 NOTE — Transfer of Care (Signed)
Immediate Anesthesia Transfer of Care Note  Patient: Gabriel Stephens  Procedure(s) Performed: SUPRA-UMBILICAL HERNIA repair (Abdomen)  Patient Location: PACU  Anesthesia Type:General  Level of Consciousness: sedated  Airway & Oxygen Therapy: Patient Spontanous Breathing and Patient connected to face mask oxygen  Post-op Assessment: Report given to RN and Post -op Vital signs reviewed and stable  Post vital signs: Reviewed and stable  Last Vitals:  Vitals Value Taken Time  BP    Temp    Pulse 67 05/16/22 1527  Resp    SpO2 97 % 05/16/22 1527  Vitals shown include unvalidated device data.  Last Pain:  Vitals:   05/16/22 1216  TempSrc: Oral  PainSc: 0-No pain      Patients Stated Pain Goal: 3 ([id number])  Complications: No notable events documented.

## 2022-05-16 NOTE — Discharge Instructions (Signed)
NO TYLENOL TILL 6:30PM  Post Anesthesia Home Care Instructions  Activity: Get plenty of rest for the remainder of the day. A responsible individual must stay with you for 24 hours following the procedure.  For the next 24 hours, DO NOT: -Drive a car -Paediatric nurse -Drink alcoholic beverages -Take any medication unless instructed by your physician -Make any legal decisions or sign important papers.  Meals: Start with liquid foods such as gelatin or soup. Progress to regular foods as tolerated. Avoid greasy, spicy, heavy foods. If nausea and/or vomiting occur, drink only clear liquids until the nausea and/or vomiting subsides. Call your physician if vomiting continues.  Special Instructions/Symptoms: Your throat may feel dry or sore from the anesthesia or the breathing tube placed in your throat during surgery. If this causes discomfort, gargle with warm salt water. The discomfort should disappear within 24 hours.  If you had a scopolamine patch placed behind your ear for the management of post- operative nausea and/or vomiting:  1. The medication in the patch is effective for 72 hours, after which it should be removed.  Wrap patch in a tissue and discard in the trash. Wash hands thoroughly with soap and water. 2. You may remove the patch earlier than 72 hours if you experience unpleasant side effects which may include dry mouth, dizziness or visual disturbances. 3. Avoid touching the patch. Wash your hands with soap and water after contact with the patch.

## 2022-05-16 NOTE — Anesthesia Procedure Notes (Signed)
Procedure Name: LMA Insertion Date/Time: 05/16/2022 2:43 PM  Performed by: Willa Frater, CRNAPre-anesthesia Checklist: Patient identified, Emergency Drugs available, Suction available and Patient being monitored Patient Re-evaluated:Patient Re-evaluated prior to induction Oxygen Delivery Method: Circle system utilized Preoxygenation: Pre-oxygenation with 100% oxygen Induction Type: IV induction Ventilation: Mask ventilation without difficulty LMA: LMA inserted LMA Size: 4.0 Number of attempts: 1 Airway Equipment and Method: Bite block Placement Confirmation: positive ETCO2 Tube secured with: Tape Dental Injury: Teeth and Oropharynx as per pre-operative assessment

## 2022-05-17 NOTE — Addendum Note (Signed)
Addendum  created 05/17/22 1800 by Santa Lighter, MD   Clinical Note Signed

## 2022-05-17 NOTE — Anesthesia Postprocedure Evaluation (Addendum)
Anesthesia Post Note  Patient: Gabriel Stephens  Procedure(s) Performed: SUPRA-UMBILICAL HERNIA repair (Abdomen)     Patient location during evaluation: PACU Anesthesia Type: General Level of consciousness: awake and alert Pain management: pain level controlled Vital Signs Assessment: post-procedure vital signs reviewed and stable Respiratory status: spontaneous breathing, nonlabored ventilation, respiratory function stable and patient connected to nasal cannula oxygen Cardiovascular status: blood pressure returned to baseline and stable Postop Assessment: no apparent nausea or vomiting Anesthetic complications: no   No notable events documented.  Last Vitals:  Vitals:   05/16/22 1545 05/16/22 1611  BP: 115/74 122/66  Pulse: 67 70  Resp: 14 18  Temp:  (!) 36.2 C  SpO2: 98% 95%    Last Pain:  Vitals:   05/16/22 1611  TempSrc:   PainSc: 0-No pain                 Santa Lighter

## 2022-05-19 ENCOUNTER — Encounter (HOSPITAL_BASED_OUTPATIENT_CLINIC_OR_DEPARTMENT_OTHER): Payer: Self-pay | Admitting: General Surgery

## 2023-05-19 ENCOUNTER — Other Ambulatory Visit (HOSPITAL_COMMUNITY): Payer: Self-pay | Admitting: Family Medicine

## 2023-05-19 DIAGNOSIS — E7800 Pure hypercholesterolemia, unspecified: Secondary | ICD-10-CM

## 2023-05-26 ENCOUNTER — Ambulatory Visit (HOSPITAL_COMMUNITY)
Admission: RE | Admit: 2023-05-26 | Discharge: 2023-05-26 | Disposition: A | Discharge status: Home / Self Care | Payer: Self-pay | Source: Ambulatory Visit | Attending: Family Medicine | Admitting: Family Medicine

## 2023-05-26 DIAGNOSIS — E7800 Pure hypercholesterolemia, unspecified: Secondary | ICD-10-CM | POA: Insufficient documentation
